# Patient Record
Sex: Female | Born: 1975 | Hispanic: No | Marital: Married | Smoking: Never smoker
Health system: Southern US, Community
[De-identification: ages and names within clinical notes are randomized; demographics above are authoritative.]

## PROBLEM LIST (undated history)

## (undated) DIAGNOSIS — E7800 Pure hypercholesterolemia, unspecified: Secondary | ICD-10-CM

## (undated) DIAGNOSIS — E119 Type 2 diabetes mellitus without complications: Secondary | ICD-10-CM

## (undated) DIAGNOSIS — I10 Essential (primary) hypertension: Secondary | ICD-10-CM

---

## 2001-02-23 ENCOUNTER — Encounter: Admission: RE | Admit: 2001-02-23 | Discharge: 2001-02-23 | Payer: Self-pay | Admitting: Sports Medicine

## 2001-03-08 ENCOUNTER — Encounter: Admission: RE | Admit: 2001-03-08 | Discharge: 2001-03-08 | Payer: Self-pay | Admitting: Family Medicine

## 2001-03-22 ENCOUNTER — Encounter: Admission: RE | Admit: 2001-03-22 | Discharge: 2001-03-22 | Payer: Self-pay | Admitting: Family Medicine

## 2001-04-23 ENCOUNTER — Encounter: Admission: RE | Admit: 2001-04-23 | Discharge: 2001-04-23 | Payer: Self-pay | Admitting: Family Medicine

## 2001-05-01 ENCOUNTER — Inpatient Hospital Stay (HOSPITAL_COMMUNITY): Admission: AD | Admit: 2001-05-01 | Discharge: 2001-05-03 | Payer: Self-pay | Admitting: *Deleted

## 2002-10-17 ENCOUNTER — Ambulatory Visit (HOSPITAL_COMMUNITY): Admission: RE | Admit: 2002-10-17 | Discharge: 2002-10-17 | Payer: Self-pay | Admitting: *Deleted

## 2002-10-18 ENCOUNTER — Ambulatory Visit (HOSPITAL_COMMUNITY): Admission: RE | Admit: 2002-10-18 | Discharge: 2002-10-18 | Payer: Self-pay | Admitting: *Deleted

## 2002-10-18 ENCOUNTER — Encounter: Payer: Self-pay | Admitting: *Deleted

## 2002-11-09 ENCOUNTER — Encounter: Admission: RE | Admit: 2002-11-09 | Discharge: 2002-11-09 | Payer: Self-pay | Admitting: *Deleted

## 2002-11-09 ENCOUNTER — Ambulatory Visit (HOSPITAL_COMMUNITY): Admission: RE | Admit: 2002-11-09 | Discharge: 2002-11-09 | Payer: Self-pay | Admitting: *Deleted

## 2002-11-23 ENCOUNTER — Encounter: Admission: RE | Admit: 2002-11-23 | Discharge: 2002-11-23 | Payer: Self-pay | Admitting: *Deleted

## 2002-12-07 ENCOUNTER — Encounter: Admission: RE | Admit: 2002-12-07 | Discharge: 2002-12-07 | Payer: Self-pay | Admitting: *Deleted

## 2002-12-20 ENCOUNTER — Encounter: Admission: RE | Admit: 2002-12-20 | Discharge: 2002-12-20 | Payer: Self-pay | Admitting: *Deleted

## 2002-12-28 ENCOUNTER — Encounter: Admission: RE | Admit: 2002-12-28 | Discharge: 2002-12-28 | Payer: Self-pay | Admitting: *Deleted

## 2002-12-28 ENCOUNTER — Inpatient Hospital Stay (HOSPITAL_COMMUNITY): Admission: AD | Admit: 2002-12-28 | Discharge: 2003-01-01 | Payer: Self-pay | Admitting: *Deleted

## 2003-01-04 ENCOUNTER — Encounter: Admission: RE | Admit: 2003-01-04 | Discharge: 2003-01-04 | Payer: Self-pay | Admitting: *Deleted

## 2003-01-04 ENCOUNTER — Ambulatory Visit (HOSPITAL_COMMUNITY): Admission: RE | Admit: 2003-01-04 | Discharge: 2003-01-04 | Payer: Self-pay | Admitting: *Deleted

## 2003-01-18 ENCOUNTER — Encounter: Admission: RE | Admit: 2003-01-18 | Discharge: 2003-01-18 | Payer: Self-pay | Admitting: *Deleted

## 2003-01-18 ENCOUNTER — Ambulatory Visit (HOSPITAL_COMMUNITY): Admission: RE | Admit: 2003-01-18 | Discharge: 2003-01-18 | Payer: Self-pay | Admitting: Obstetrics and Gynecology

## 2003-01-25 ENCOUNTER — Encounter: Admission: RE | Admit: 2003-01-25 | Discharge: 2003-01-25 | Payer: Self-pay | Admitting: *Deleted

## 2003-02-28 ENCOUNTER — Inpatient Hospital Stay (HOSPITAL_COMMUNITY): Admission: AD | Admit: 2003-02-28 | Discharge: 2003-02-28 | Payer: Self-pay | Admitting: Obstetrics & Gynecology

## 2003-04-24 ENCOUNTER — Inpatient Hospital Stay (HOSPITAL_COMMUNITY): Admission: AD | Admit: 2003-04-24 | Discharge: 2003-04-27 | Payer: Self-pay | Admitting: Obstetrics & Gynecology

## 2006-04-02 ENCOUNTER — Ambulatory Visit: Payer: Self-pay | Admitting: Family Medicine

## 2006-04-02 ENCOUNTER — Encounter: Payer: Self-pay | Admitting: Family Medicine

## 2006-04-02 LAB — CONVERTED CEMR LAB
Basophils Absolute: 0 10*3/uL (ref 0.0–0.1)
Basophils Relative: 0 % (ref 0–1)
HCT: 38.2 % (ref 36.0–46.0)
Hemoglobin: 13.2 g/dL (ref 12.0–15.0)
Hepatitis B Surface Ag: NEGATIVE
MCHC: 34.6 g/dL (ref 30.0–36.0)
Monocytes Absolute: 0.3 10*3/uL (ref 0.2–0.7)
Neutrophils Relative %: 61 % (ref 43–77)
RBC: 4.24 M/uL (ref 3.87–5.11)
RDW: 13.4 % (ref 11.5–14.0)
Rh Type: POSITIVE
Rubella: 8.5 intl units/mL — ABNORMAL HIGH
WBC: 5.8 10*3/uL (ref 4.0–10.5)

## 2006-04-08 ENCOUNTER — Encounter (INDEPENDENT_AMBULATORY_CARE_PROVIDER_SITE_OTHER): Payer: Self-pay | Admitting: *Deleted

## 2006-04-08 ENCOUNTER — Ambulatory Visit: Payer: Self-pay | Admitting: Family Medicine

## 2006-04-08 ENCOUNTER — Encounter: Payer: Self-pay | Admitting: Family Medicine

## 2006-04-08 LAB — CONVERTED CEMR LAB
Chlamydia, DNA Probe: NEGATIVE
GC Probe Amp, Genital: NEGATIVE

## 2006-04-13 ENCOUNTER — Ambulatory Visit (HOSPITAL_COMMUNITY): Admission: RE | Admit: 2006-04-13 | Discharge: 2006-04-13 | Payer: Self-pay | Admitting: Sports Medicine

## 2006-05-06 ENCOUNTER — Ambulatory Visit: Payer: Self-pay | Admitting: Family Medicine

## 2006-05-11 ENCOUNTER — Ambulatory Visit (HOSPITAL_COMMUNITY): Admission: RE | Admit: 2006-05-11 | Discharge: 2006-05-11 | Payer: Self-pay | Admitting: Sports Medicine

## 2006-05-13 ENCOUNTER — Ambulatory Visit: Payer: Self-pay | Admitting: Family Medicine

## 2006-06-08 ENCOUNTER — Ambulatory Visit (HOSPITAL_COMMUNITY): Admission: RE | Admit: 2006-06-08 | Discharge: 2006-06-08 | Payer: Self-pay | Admitting: Sports Medicine

## 2006-06-23 ENCOUNTER — Ambulatory Visit: Payer: Self-pay | Admitting: Family Medicine

## 2006-07-07 ENCOUNTER — Encounter: Payer: Self-pay | Admitting: Family Medicine

## 2006-07-07 ENCOUNTER — Ambulatory Visit (HOSPITAL_COMMUNITY): Admission: RE | Admit: 2006-07-07 | Discharge: 2006-07-07 | Payer: Self-pay | Admitting: Sports Medicine

## 2006-07-08 ENCOUNTER — Encounter: Payer: Self-pay | Admitting: Family Medicine

## 2006-07-23 ENCOUNTER — Ambulatory Visit: Payer: Self-pay | Admitting: Family Medicine

## 2006-07-27 ENCOUNTER — Encounter: Payer: Self-pay | Admitting: Family Medicine

## 2006-07-27 ENCOUNTER — Ambulatory Visit: Payer: Self-pay | Admitting: Family Medicine

## 2006-07-27 LAB — CONVERTED CEMR LAB: Hemoglobin: 12.3 g/dL

## 2006-07-31 ENCOUNTER — Ambulatory Visit: Payer: Self-pay | Admitting: Family Medicine

## 2006-08-06 ENCOUNTER — Ambulatory Visit: Payer: Self-pay | Admitting: Family Medicine

## 2006-08-21 ENCOUNTER — Ambulatory Visit: Payer: Self-pay | Admitting: Family Medicine

## 2006-08-21 ENCOUNTER — Encounter: Payer: Self-pay | Admitting: Family Medicine

## 2006-09-08 ENCOUNTER — Ambulatory Visit: Payer: Self-pay | Admitting: Family Medicine

## 2006-09-08 ENCOUNTER — Encounter: Payer: Self-pay | Admitting: Family Medicine

## 2006-09-16 ENCOUNTER — Ambulatory Visit: Payer: Self-pay | Admitting: Sports Medicine

## 2006-09-23 ENCOUNTER — Ambulatory Visit: Payer: Self-pay | Admitting: Family Medicine

## 2006-09-23 ENCOUNTER — Ambulatory Visit (HOSPITAL_COMMUNITY): Admission: RE | Admit: 2006-09-23 | Discharge: 2006-09-23 | Payer: Self-pay | Admitting: Family Medicine

## 2006-09-30 ENCOUNTER — Ambulatory Visit: Payer: Self-pay | Admitting: Family Medicine

## 2006-09-30 LAB — CONVERTED CEMR LAB
KOH Prep: POSITIVE
Whiff Test: NEGATIVE

## 2006-10-06 ENCOUNTER — Ambulatory Visit: Payer: Self-pay | Admitting: Family Medicine

## 2006-10-12 ENCOUNTER — Ambulatory Visit: Payer: Self-pay | Admitting: Family Medicine

## 2006-10-12 ENCOUNTER — Inpatient Hospital Stay (HOSPITAL_COMMUNITY): Admission: AD | Admit: 2006-10-12 | Discharge: 2006-10-12 | Payer: Self-pay | Admitting: Obstetrics & Gynecology

## 2006-10-12 ENCOUNTER — Ambulatory Visit: Payer: Self-pay | Admitting: *Deleted

## 2006-10-14 ENCOUNTER — Ambulatory Visit: Payer: Self-pay | Admitting: Gynecology

## 2006-10-14 ENCOUNTER — Inpatient Hospital Stay (HOSPITAL_COMMUNITY): Admission: AD | Admit: 2006-10-14 | Discharge: 2006-10-16 | Payer: Self-pay | Admitting: Gynecology

## 2006-12-03 ENCOUNTER — Ambulatory Visit: Payer: Self-pay | Admitting: Family Medicine

## 2006-12-03 LAB — CONVERTED CEMR LAB: Beta hcg, urine, semiquantitative: NEGATIVE

## 2006-12-10 ENCOUNTER — Encounter: Payer: Self-pay | Admitting: Family Medicine

## 2007-02-22 ENCOUNTER — Telehealth (INDEPENDENT_AMBULATORY_CARE_PROVIDER_SITE_OTHER): Payer: Self-pay | Admitting: *Deleted

## 2007-03-02 ENCOUNTER — Ambulatory Visit: Payer: Self-pay | Admitting: Family Medicine

## 2007-03-16 ENCOUNTER — Encounter: Payer: Self-pay | Admitting: Family Medicine

## 2008-02-22 IMAGING — US US OB FOLLOW-UP
1 series · 14 of 25 positions shown · non-contrast
Comparison: none

OBSTETRICAL ULTRASOUND:

 This ultrasound exam was performed in the [HOSPITAL] Ultrasound Department.  The OB US report was generated in the AS system, and faxed to the ordering physician.  This report is also available in [REDACTED] PACS.

[Series 1: us ob re-eval · 14 of 25 slices shown]
[im 1/25]
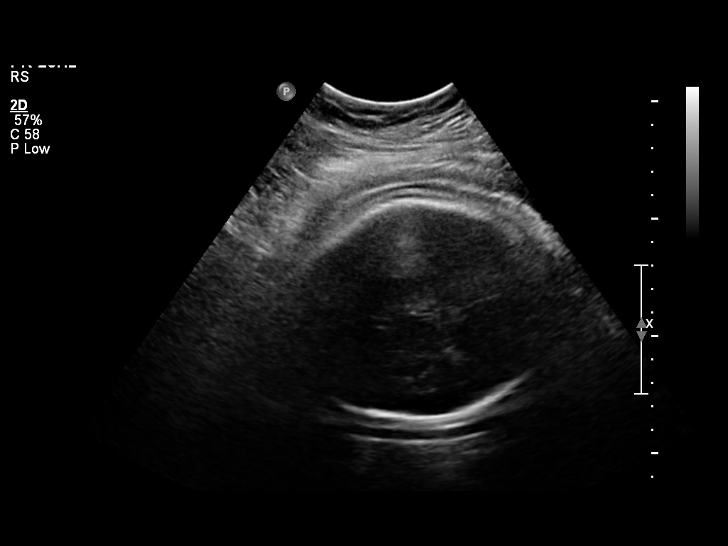
[im 3/25]
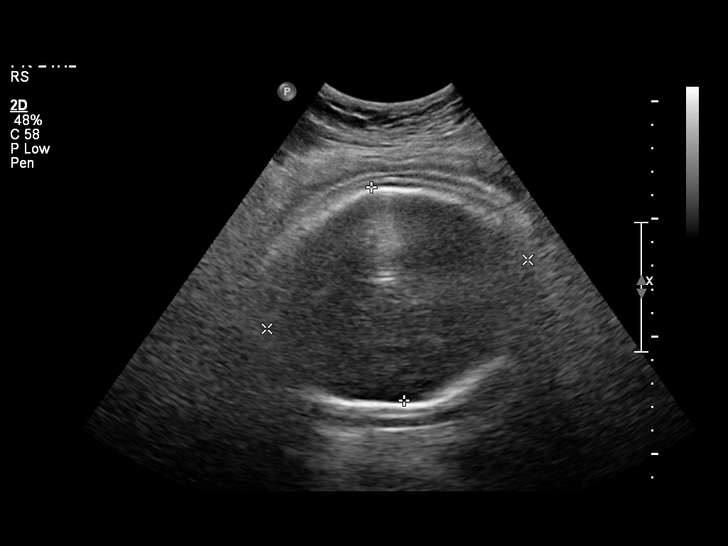
[im 5/25]
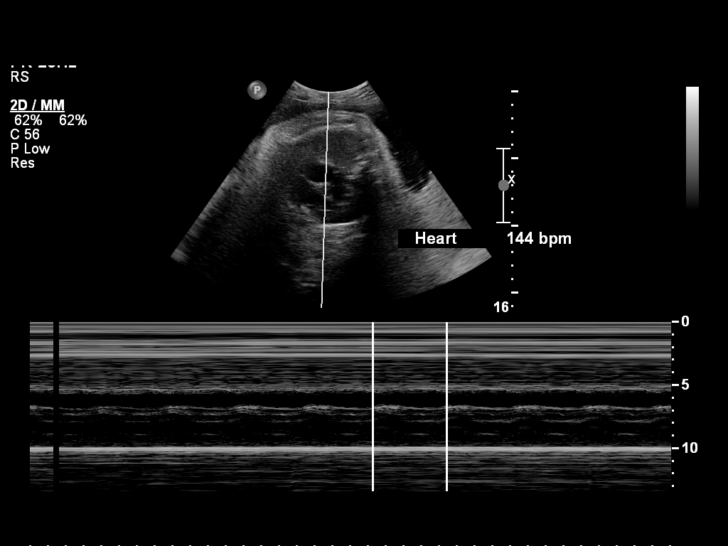
[im 7/25]
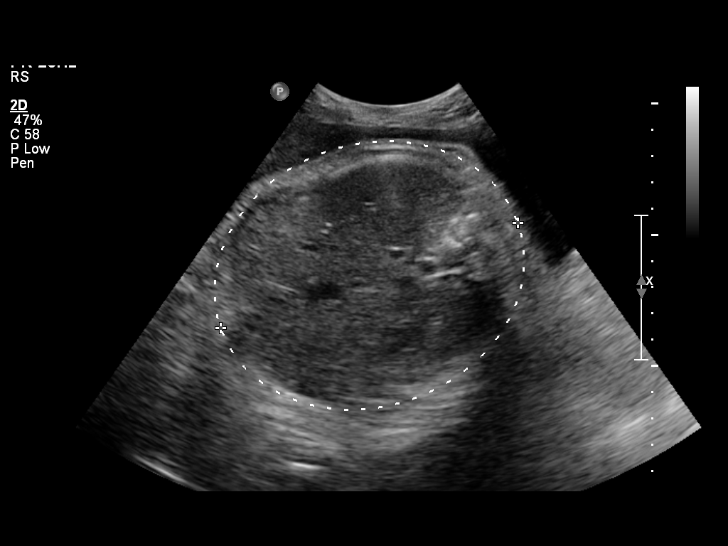
[im 9/25]
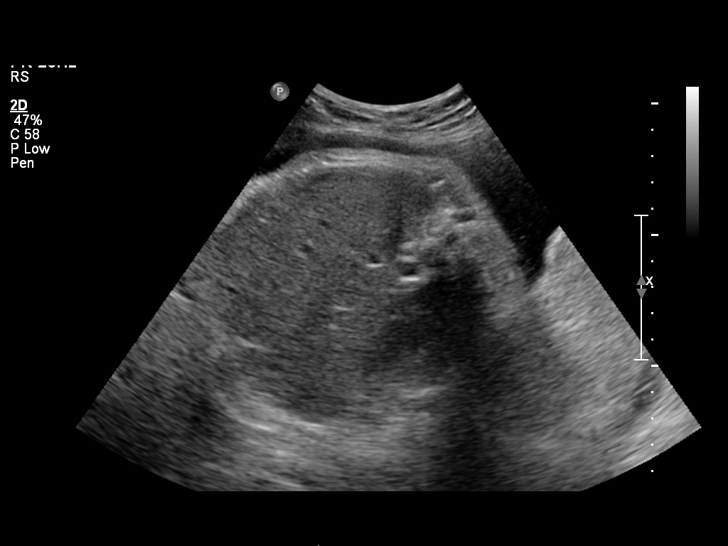
[im 10/25]
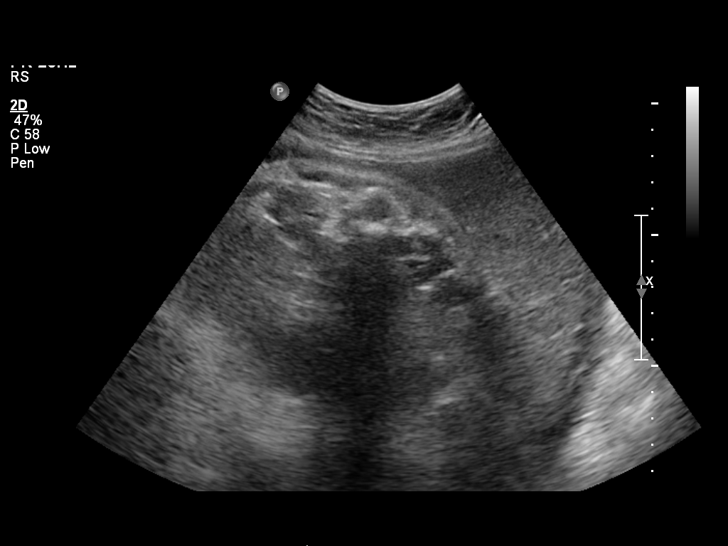
[im 12/25]
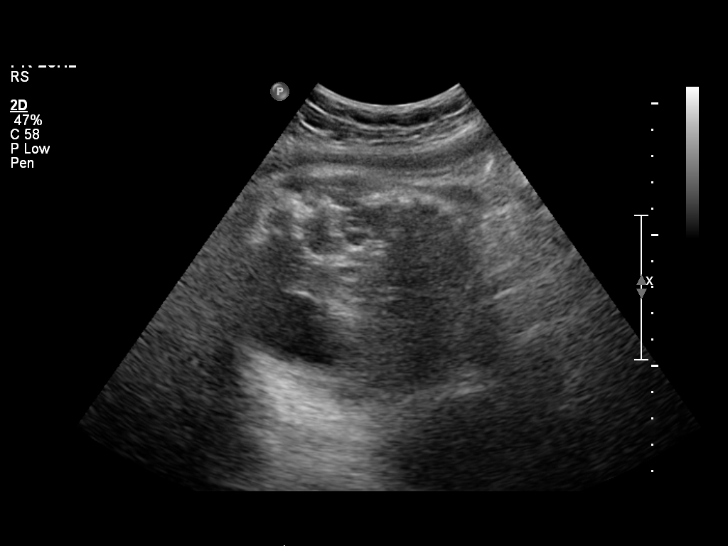
[im 14/25]
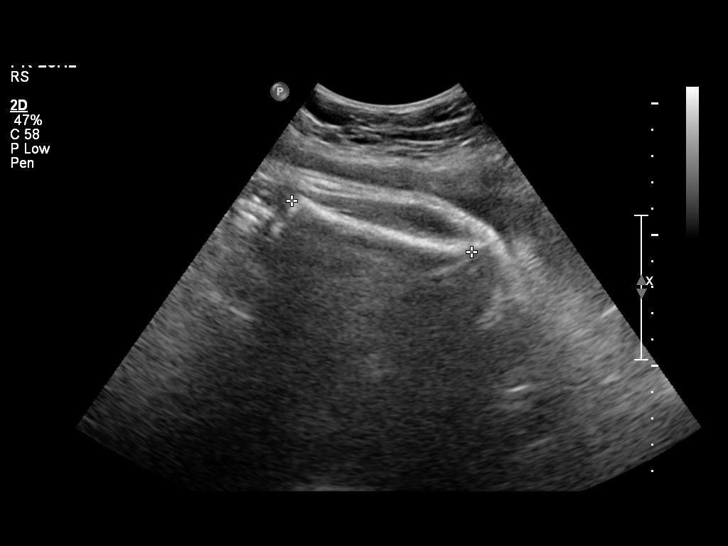
[im 16/25]
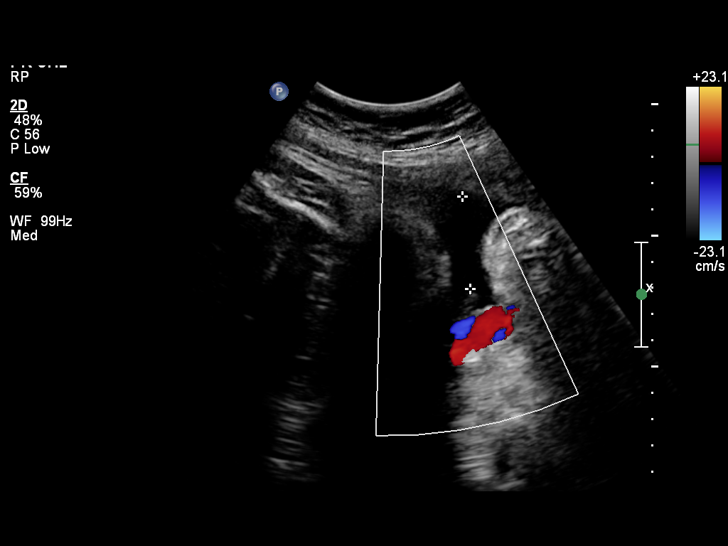
[im 17/25]
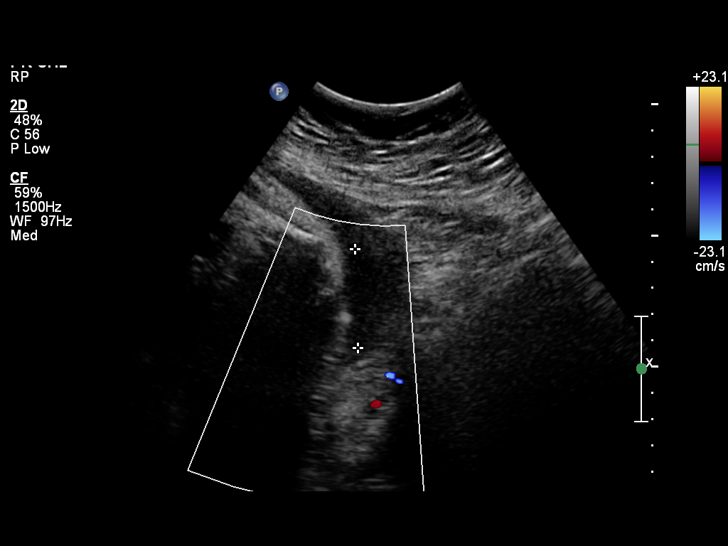
[im 19/25]
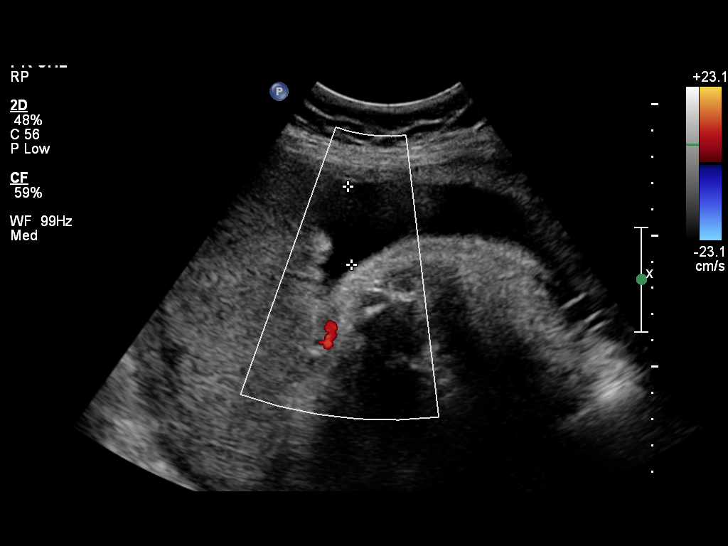
[im 21/25]
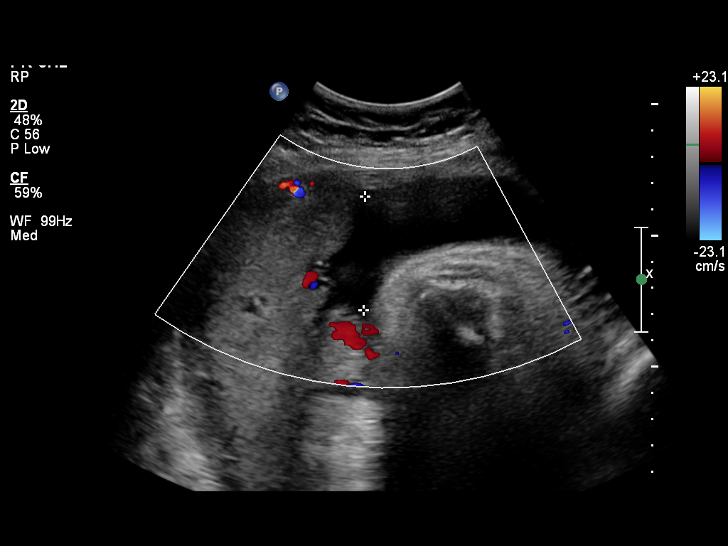
[im 23/25]
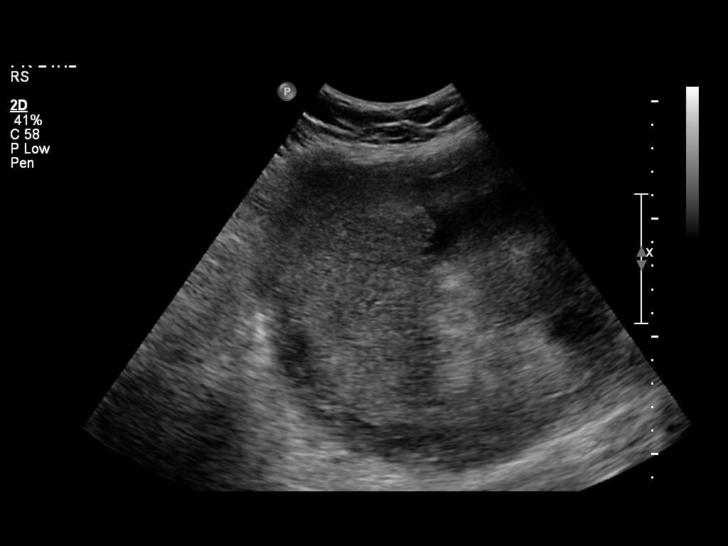
[im 25/25]
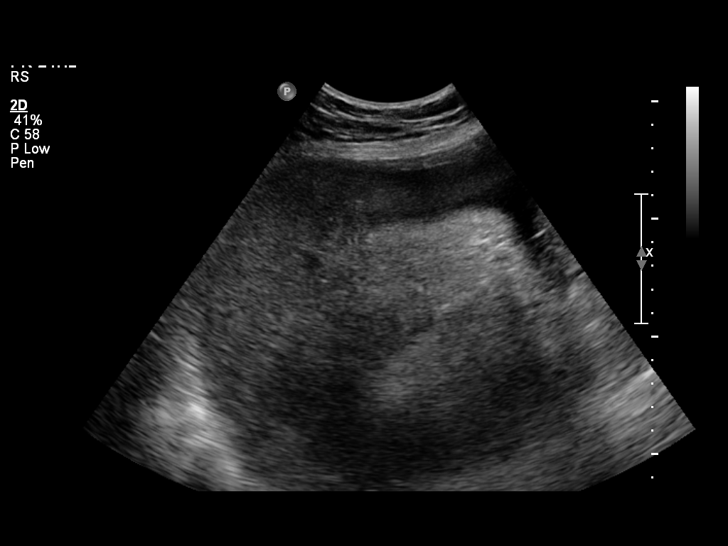

[14 of 25 positions shown; findings below may reference images not displayed]

IMPRESSION: See AS Obstetric US report.

## 2010-04-07 ENCOUNTER — Encounter: Payer: Self-pay | Admitting: Sports Medicine

## 2010-08-02 NOTE — Discharge Summary (Signed)
   NAMECHANNELLE, BOTTGER                   ACCOUNT NO.:  [id number]   MEDICAL RECORD NO.:  [id number]                   PATIENT TYPE:  INP   LOCATION:  9108                                 FACILITY:  [facility]   PHYSICIAN:  Phil D. Okey Dupre, M.D.                  DATE OF BIRTH:  08-28-1975   DATE OF ADMISSION:  12/28/2002  DATE OF DISCHARGE:  01/01/2003                                 DISCHARGE SUMMARY   ADMISSION DIAGNOSES:  56. 35 year old gravida 3, para 1, 1, 0, 1, at 27-1/7 weeks with group B     streptococcus bacteria.  2. Fetal anomaly of a severe meningomyelocele.  3. Cervical change of 1, 50, and high.   DISCHARGE DIAGNOSES:  54. 35 year old gravida 3, para 1, 1, 0, 1, at 27-5/7 weeks.  2. Status post three days of intravenous antibiotics for group B     Streptococcus bacteria.  3. Unchanged cervical exam.   DISCHARGE MEDICATION:  Prenatal vitamin on p.o. daily.   ADMISSION HISTORY:  Ms. Jacquenette Shone was admitted at 27-1/7 weeks from  [facility] for GBS bacteria.  She was placed on IV Unasyn for a  three-day course.   HOSPITAL COURSE:  On admission she was noted to have a cervical exam of 1,  50 and high.  She had no contractions during her hospital stay.  On the day  of discharge she had no complaints.  Her cervical exam was unchanged.   IMPRESSION:  1. GBS bacteria - she remained afebrile and asymptomatic.  Her GC and     chlamydia were noted to be negative and a fetal fibrinogen was done which     was also negative.  2. Meningomyelocele.  The plan is to have delivery at [facility].     According to the patient this has not been arranged but she does have     follow up [facility].   CONDITION ON DISCHARGE:  The patient was discharged home in stable  condition.   INSTRUCTIONS GIVEN TO PATIENT:  The patient was told of her medial regimen.  She was told to keep her [facility] appointment three days after  discharge.     Maurice March, M.D.                        Phil D. Okey Dupre, M.D.    LC/MEDQ  D:  01/01/2003  T:  01/02/2003  Job:  [id number]   cc:   [facility]

## 2010-08-02 NOTE — Discharge Summary (Signed)
Stephanie Brady, Stephanie Brady                   ACCOUNT NO.:  [id number]   MEDICAL RECORD NO.:  [id number]                   PATIENT TYPE:  INP   LOCATION:  9320                                 FACILITY:  [facility]   PHYSICIAN:  Lesly Dukes, M.D.              DATE OF BIRTH:  [birth year]   DATE OF ADMISSION:  04/24/2003  DATE OF DISCHARGE:  04/27/2003                                 DISCHARGE SUMMARY   DISCHARGE DIAGNOSES:  1. Pyelonephritis.  2. Postpartum 5 weeks.   DISCHARGE MEDICATIONS:  1. Ceftin 500 mg b.i.d. x10 days.  2. Tylenol p.r.n.   SPECIAL INSTRUCTIONS:  Patient is to return to the MAU if abdominal pain,  back pain, or fever return.   FOLLOW UP:  Patient will keep her 6-week postpartum appointment.   BRIEF ADMISSION HISTORY:  [age]-year-old G3, P2-1-0-3, who presented at  5 weeks postpartum with lower abdominal pain.  Patient had a C-section in  January and __________ in the hospital.  Patient reports being sick x2 days  with fever and lower abdominal pain, some nausea before admission.  She had  been taking her ibuprofen and Motrin p.r.n.  On admission no CVA tenderness  was noted, tender midline in the lower abdomen on physical exam, urinalysis  showed moderate leukocyte esterase, 30 protein, large hemoglobin and micro  with white blood cells 21-50, wet prep showed moderate white blood cells,  initial white blood cell count was 8.1.   HOSPITAL COURSE:  Patient was admitted for pyelonephritis and was begun on  Rocephin q.24h. 1 g IV.  Patient continued to spike temperatures throughout  hospitalization with a T-max of 103.7.  Patient had been afebrile  approximately 23 hours at discharge.  Considering patient's continued  temperature spikes blood cultures x2 were drawn and had no growth to date at  discharge.  Repeat CBC showed white blood cell count had decreased to 4.3.  Urine culture grew out E. coli greater than 100,000 colonies pan-sensitive  except for  Bactrim.  Patient did have a urine pregnancy test that was  negative on admission.  Patient was asymptomatic at discharge and was  discharged with Ceftin 500 mg b.i.d. x10 days and it was explained to her  that it was important to complete the course of medication.     Anastasio Auerbach, MD                          Lesly Dukes, M.D.    AD/MEDQ  D:  04/27/2003  T:  04/27/2003  Job:  [id number]

## 2010-09-14 ENCOUNTER — Emergency Department (HOSPITAL_COMMUNITY): Payer: Self-pay

## 2010-09-14 ENCOUNTER — Emergency Department (HOSPITAL_COMMUNITY)
Admission: EM | Admit: 2010-09-14 | Discharge: 2010-09-14 | Disposition: A | Discharge status: Home / Self Care | Payer: Self-pay | Attending: Emergency Medicine | Admitting: Emergency Medicine

## 2010-09-14 DIAGNOSIS — R0602 Shortness of breath: Secondary | ICD-10-CM | POA: Insufficient documentation

## 2010-09-14 DIAGNOSIS — R109 Unspecified abdominal pain: Secondary | ICD-10-CM | POA: Insufficient documentation

## 2010-09-14 DIAGNOSIS — R110 Nausea: Secondary | ICD-10-CM | POA: Insufficient documentation

## 2010-09-14 DIAGNOSIS — K8020 Calculus of gallbladder without cholecystitis without obstruction: Secondary | ICD-10-CM | POA: Insufficient documentation

## 2010-09-14 DIAGNOSIS — R079 Chest pain, unspecified: Secondary | ICD-10-CM | POA: Insufficient documentation

## 2010-09-14 LAB — URINALYSIS, ROUTINE W REFLEX MICROSCOPIC
Bilirubin Urine: NEGATIVE
Glucose, UA: NEGATIVE mg/dL
Hgb urine dipstick: NEGATIVE
Specific Gravity, Urine: 1.01 (ref 1.005–1.030)

## 2010-09-14 LAB — COMPREHENSIVE METABOLIC PANEL
ALT: 34 U/L (ref 0–35)
Albumin: 3.8 g/dL (ref 3.5–5.2)
Alkaline Phosphatase: 72 U/L (ref 39–117)
Chloride: 99 mEq/L (ref 96–112)
Glucose, Bld: 151 mg/dL — ABNORMAL HIGH (ref 70–99)
Potassium: 3 mEq/L — ABNORMAL LOW (ref 3.5–5.1)
Sodium: 135 mEq/L (ref 135–145)
Total Bilirubin: 0.8 mg/dL (ref 0.3–1.2)
Total Protein: 6.7 g/dL (ref 6.0–8.3)

## 2010-09-14 LAB — DIFFERENTIAL
Basophils Absolute: 0 10*3/uL (ref 0.0–0.1)
Basophils Relative: 0 % (ref 0–1)
Lymphocytes Relative: 27 % (ref 12–46)
Monocytes Absolute: 0.5 10*3/uL (ref 0.1–1.0)
Neutro Abs: 6 10*3/uL (ref 1.7–7.7)
Neutrophils Relative %: 65 % (ref 43–77)

## 2010-09-14 LAB — CBC
HCT: 37.2 % (ref 36.0–46.0)
Hemoglobin: 13.4 g/dL (ref 12.0–15.0)
MCHC: 36 g/dL (ref 30.0–36.0)
WBC: 9.3 10*3/uL (ref 4.0–10.5)

## 2010-09-14 LAB — URINE MICROSCOPIC-ADD ON

## 2010-12-30 LAB — RPR: RPR Ser Ql: NONREACTIVE

## 2010-12-30 LAB — CBC
MCHC: 35
RDW: 13.6

## 2011-02-10 DIAGNOSIS — K8020 Calculus of gallbladder without cholecystitis without obstruction: Secondary | ICD-10-CM | POA: Insufficient documentation

## 2014-11-14 DIAGNOSIS — E119 Type 2 diabetes mellitus without complications: Secondary | ICD-10-CM | POA: Insufficient documentation

## 2014-11-14 DIAGNOSIS — I10 Essential (primary) hypertension: Secondary | ICD-10-CM | POA: Insufficient documentation

## 2016-08-02 DIAGNOSIS — E1140 Type 2 diabetes mellitus with diabetic neuropathy, unspecified: Secondary | ICD-10-CM | POA: Insufficient documentation

## 2016-11-18 DIAGNOSIS — R112 Nausea with vomiting, unspecified: Secondary | ICD-10-CM | POA: Insufficient documentation

## 2016-11-18 DIAGNOSIS — E872 Acidosis, unspecified: Secondary | ICD-10-CM | POA: Insufficient documentation

## 2016-11-18 DIAGNOSIS — R0981 Nasal congestion: Secondary | ICD-10-CM | POA: Insufficient documentation

## 2017-05-06 DIAGNOSIS — R5383 Other fatigue: Secondary | ICD-10-CM | POA: Insufficient documentation

## 2017-06-16 DIAGNOSIS — S46819A Strain of other muscles, fascia and tendons at shoulder and upper arm level, unspecified arm, initial encounter: Secondary | ICD-10-CM | POA: Insufficient documentation

## 2017-07-13 DIAGNOSIS — M79676 Pain in unspecified toe(s): Secondary | ICD-10-CM | POA: Insufficient documentation

## 2017-07-13 DIAGNOSIS — L600 Ingrowing nail: Secondary | ICD-10-CM | POA: Insufficient documentation

## 2017-07-29 ENCOUNTER — Other Ambulatory Visit: Payer: Self-pay

## 2017-07-29 ENCOUNTER — Ambulatory Visit (HOSPITAL_COMMUNITY)
Admission: EM | Admit: 2017-07-29 | Discharge: 2017-07-29 | Disposition: A | Discharge status: Home / Self Care | Payer: Self-pay | Attending: Family Medicine | Admitting: Family Medicine

## 2017-07-29 ENCOUNTER — Encounter (HOSPITAL_COMMUNITY): Payer: Self-pay | Admitting: Emergency Medicine

## 2017-07-29 DIAGNOSIS — L600 Ingrowing nail: Secondary | ICD-10-CM

## 2017-07-29 HISTORY — DX: Type 2 diabetes mellitus without complications: E11.9

## 2017-07-29 HISTORY — DX: Pure hypercholesterolemia, unspecified: E78.00

## 2017-07-29 HISTORY — DX: Essential (primary) hypertension: I10

## 2017-07-29 MED ORDER — HYDROCODONE-ACETAMINOPHEN 5-325 MG PO TABS: 1.0000 | ORAL_TABLET | Freq: Four times a day (QID) | ORAL | 0 refills | Status: AC | PRN

## 2017-07-29 NOTE — ED Triage Notes (Signed)
Daughter/translator stated, she has an ingrown toenail on rt. Big toe. For 2 weeks.

## 2017-07-29 NOTE — ED Provider Notes (Signed)
[facility]   [id number] 07/29/17 Arrival Time: 1656   SUBJECTIVE:  Stephanie Brady is a 42 y.o. female who presents to the urgent care with complaint of ingrown toenail on rt. Big toe. For 2 weeks.  Patient is being treated for nail fungus  Past Medical History:  Diagnosis Date  . Diabetes mellitus without complication (HCC)   . Hypercholesterolemia   . Hypertension    No family history on file. Social History   Socioeconomic History  . Marital status: Married    Spouse name: Not on file  . Number of children: Not on file  . Years of education: Not on file  . Highest education level: Not on file  Occupational History  . Not on file  Social Needs  . Financial resource strain: Not on file  . Food insecurity:    Worry: Not on file    Inability: Not on file  . Transportation needs:    Medical: Not on file    Non-medical: Not on file  Tobacco Use  . Smoking status: Never Smoker  . Smokeless tobacco: Never Used  Substance and Sexual Activity  . Alcohol use: Not Currently  . Drug use: Not Currently  . Sexual activity: Not on file  Lifestyle  . Physical activity:    Days per week: Not on file    Minutes per session: Not on file  . Stress: Not on file  Relationships  . Social connections:    Talks on phone: Not on file    Gets together: Not on file    Attends religious service: Not on file    Active member of club or organization: Not on file    Attends meetings of clubs or organizations: Not on file    Relationship status: Not on file  . Intimate partner violence:    Fear of current or ex partner: Not on file    Emotionally abused: Not on file    Physically abused: Not on file    Forced sexual activity: Not on file  Other Topics Concern  . Not on file  Social History Narrative  . Not on file   No outpatient medications have been marked as taking for the 07/29/17 encounter [facility] Encounter).   Not on File    ROS: As per HPI,  remainder of ROS negative.   OBJECTIVE:   Vitals:   07/29/17 1826 07/29/17 1827 07/29/17 1828  BP: 116/71    Pulse: 63    Resp: 16    Temp: 98.4 F (36.9 C)    TempSrc: Oral    SpO2: 100%    Weight:  195 lb (88.5 kg)   Height:    (1.575 m)     General appearance: alert; no distress Eyes: PERRL; EOMI; conjunctiva normal HENT: normocephalic; atraumatic;oral mucosa normal Neck: supple Skin: warm and dry; ingrown right toenail with obvious onychomycotic features. Neurologic: normal gait; grossly normal Psychological: alert and cooperative; normal mood and affect      Labs:  Results for orders placed or performed during the hospital encounter of 09/14/10  Differential  Result Value Ref Range   Neutrophils Relative % 65 43 - 77 %   Neutro Abs 6.0 1.7 - 7.7 K/uL   Lymphocytes Relative 27 12 - 46 %   Lymphs Abs 2.5 0.7 - 4.0 K/uL   Monocytes Relative 5 3 - 12 %   Monocytes Absolute 0.5 0.1 - 1.0 K/uL   Eosinophils Relative 3 0 - 5 %  Eosinophils Absolute 0.3 0.0 - 0.7 K/uL   Basophils Relative 0 0 - 1 %   Basophils Absolute 0.0 0.0 - 0.1 K/uL  CBC  Result Value Ref Range   WBC 9.3 4.0 - 10.5 K/uL   RBC 4.24 3.87 - 5.11 MIL/uL   Hemoglobin 13.4 12.0 - 15.0 g/dL   HCT 16.1 09.6 - 04.5 %   MCV 87.7 78.0 - 100.0 fL   MCH 31.6 26.0 - 34.0 pg   MCHC 36.0 30.0 - 36.0 g/dL   RDW 40.9 81.1 - 91.4 %   Platelets 202 150 - 400 K/uL  Comprehensive metabolic panel  Result Value Ref Range   Sodium 135 135 - 145 mEq/L   Potassium 3.0 (L) 3.5 - 5.1 mEq/L   Chloride 99 96 - 112 mEq/L   CO2 26 19 - 32 mEq/L   Glucose, Bld 151 (H) 70 - 99 mg/dL   BUN 14 6 - 23 mg/dL   Creatinine, Ser <7.82 (L) 0.50 - 1.10 mg/dL   Calcium 8.5 8.4 - 95.6 mg/dL   Total Protein 6.7 6.0 - 8.3 g/dL   Albumin 3.8 3.5 - 5.2 g/dL   AST 46 (H) 0 - 37 U/L   ALT 34 0 - 35 U/L   Alkaline Phosphatase 72 39 - 117 U/L   Total Bilirubin 0.8 0.3 - 1.2 mg/dL   GFR calc non Af Amer NOT CALCULATED >60  mL/min   GFR calc Af Amer NOT CALCULATED >60 mL/min  Lipase, blood  Result Value Ref Range   Lipase 27 11 - 59 U/L  Pregnancy, urine  Result Value Ref Range   Preg Test, Ur NEGATIVE   Urinalysis, Routine w reflex microscopic  Result Value Ref Range   Color, Urine YELLOW YELLOW   APPearance CLOUDY (A) CLEAR   Specific Gravity, Urine 1.010 1.005 - 1.030   pH 6.5 5.0 - 8.0   Glucose, UA NEGATIVE NEGATIVE mg/dL   Hgb urine dipstick NEGATIVE NEGATIVE   Bilirubin Urine NEGATIVE NEGATIVE   Ketones, ur NEGATIVE NEGATIVE mg/dL   Protein, ur NEGATIVE NEGATIVE mg/dL   Urobilinogen, UA 0.2 0.0 - 1.0 mg/dL   Nitrite NEGATIVE NEGATIVE   Leukocytes, UA TRACE (A) NEGATIVE  Urine microscopic-add on  Result Value Ref Range   Squamous Epithelial / LPF MANY (A) RARE   WBC, UA 0-2 <3 WBC/hpf    Labs Reviewed - No data to display  No results found.     ASSESSMENT & PLAN:  1. Ingrown toenail of right foot     Meds ordered this encounter  Medications  . HYDROcodone-acetaminophen (NORCO) 5-325 MG tablet    Sig: Take 1 tablet by mouth every 6 (six) hours as needed for moderate pain.    Dispense:  12 tablet    Refill:  0    Reviewed expectations re: course of current medical issues. Questions answered. Outlined signs and symptoms indicating need for more acute intervention. Patient verbalized understanding. After Visit Summary given.    Procedures:  Distal right toe was anesthetized with local 2% Xylocaine after spraying the area with cold spray. The tibial side of the nail was then elevated and removed without complication      Elvina Sidle, MD 07/29/17 Windell Moment

## 2017-07-29 NOTE — Discharge Instructions (Addendum)
Lava el dedo tres veces al dia para 5 dias.  Regresa si le duele mas.

## 2018-02-24 DIAGNOSIS — M5412 Radiculopathy, cervical region: Secondary | ICD-10-CM | POA: Insufficient documentation

## 2018-03-15 DIAGNOSIS — G44209 Tension-type headache, unspecified, not intractable: Secondary | ICD-10-CM | POA: Insufficient documentation

## 2018-03-15 DIAGNOSIS — R079 Chest pain, unspecified: Secondary | ICD-10-CM | POA: Insufficient documentation

## 2018-05-26 ENCOUNTER — Other Ambulatory Visit: Payer: Self-pay

## 2018-05-26 ENCOUNTER — Ambulatory Visit (INDEPENDENT_AMBULATORY_CARE_PROVIDER_SITE_OTHER): Payer: Self-pay | Admitting: Podiatry

## 2018-05-26 VITALS — BP 139/73 | HR 89

## 2018-05-26 DIAGNOSIS — E7800 Pure hypercholesterolemia, unspecified: Secondary | ICD-10-CM | POA: Insufficient documentation

## 2018-05-26 DIAGNOSIS — M7751 Other enthesopathy of right foot: Secondary | ICD-10-CM

## 2018-05-26 DIAGNOSIS — B070 Plantar wart: Secondary | ICD-10-CM

## 2018-05-26 MED ORDER — MELOXICAM 15 MG PO TABS: 15.0000 mg | ORAL_TABLET | Freq: Every day | ORAL | 1 refills | Status: AC

## 2018-05-26 NOTE — Patient Instructions (Signed)
Over-the-counter wart remover

## 2018-05-30 NOTE — Progress Notes (Signed)
   Subjective: 43 year old female with PMHx of DM presenting today as a new patient with a chief complaint of a painful lesion noted to the plantar aspect of the left foot that appeared one month ago. She also reports pain in the right great toe that began 4 months ago. Walking increases the pain from both areas. She has not had any treatment for her symptoms. Patient is here for further evaluation and treatment.    Past Medical History:  Diagnosis Date  . Diabetes mellitus without complication (HCC)   . Hypercholesterolemia   . Hypertension     Objective: Physical Exam General: The patient is alert and oriented x3 in no acute distress.   Dermatology: Hyperkeratotic skin lesion noted to the plantar aspect of the left foot approximately 1 cm in diameter. Pinpoint bleeding noted upon debridement. Skin is warm, dry and supple bilateral lower extremities. Negative for open lesions or macerations.   Vascular: Palpable pedal pulses bilaterally. No edema or erythema noted. Capillary refill within normal limits.   Neurological: Epicritic and protective threshold grossly intact bilaterally.    Musculoskeletal Exam: Pain on palpation to the noted skin lesion as well as to the right great toe.  Range of motion within normal limits to all pedal and ankle joints bilateral. Muscle strength 5/5 in all groups bilateral.    Assessment: #1 plantar wart left foot #2 right great toe capsulitis      Plan of Care:  #1 Patient was evaluated. #2 Excisional debridement of the plantar wart lesion was performed using a chisel blade. Salinocaine was applied and the lesion was dressed with a dry sterile dressing. #3 Prescription for Meloxicam provided to patient. #4 Recommended OTC wart remover.  #5 patient is to return to clinic as needed.   Felecia Shelling, DPM [facility]  Dr. Felecia Shelling, DPM    [address]                                        [place], [place] [address]                 Office [phone]  Fax [phone]

## 2018-09-08 ENCOUNTER — Encounter (HOSPITAL_COMMUNITY): Payer: Self-pay | Admitting: Emergency Medicine

## 2018-09-08 ENCOUNTER — Emergency Department (HOSPITAL_COMMUNITY)
Admission: EM | Admit: 2018-09-08 | Discharge: 2018-09-08 | Disposition: A | Discharge status: Home / Self Care | Payer: Self-pay | Attending: Emergency Medicine | Admitting: Emergency Medicine

## 2018-09-08 ENCOUNTER — Emergency Department (HOSPITAL_COMMUNITY): Payer: Self-pay

## 2018-09-08 ENCOUNTER — Other Ambulatory Visit: Payer: Self-pay | Admitting: *Deleted

## 2018-09-08 ENCOUNTER — Other Ambulatory Visit: Payer: Self-pay

## 2018-09-08 DIAGNOSIS — I10 Essential (primary) hypertension: Secondary | ICD-10-CM | POA: Insufficient documentation

## 2018-09-08 DIAGNOSIS — Z79899 Other long term (current) drug therapy: Secondary | ICD-10-CM | POA: Insufficient documentation

## 2018-09-08 DIAGNOSIS — Z20822 Contact with and (suspected) exposure to covid-19: Secondary | ICD-10-CM

## 2018-09-08 DIAGNOSIS — Z7984 Long term (current) use of oral hypoglycemic drugs: Secondary | ICD-10-CM | POA: Insufficient documentation

## 2018-09-08 DIAGNOSIS — R0602 Shortness of breath: Secondary | ICD-10-CM | POA: Insufficient documentation

## 2018-09-08 DIAGNOSIS — R079 Chest pain, unspecified: Secondary | ICD-10-CM | POA: Insufficient documentation

## 2018-09-08 DIAGNOSIS — E119 Type 2 diabetes mellitus without complications: Secondary | ICD-10-CM | POA: Insufficient documentation

## 2018-09-08 LAB — CBC
HCT: 39.9 % (ref 36.0–46.0)
Hemoglobin: 14.2 g/dL (ref 12.0–15.0)
MCH: 32.2 pg (ref 26.0–34.0)
MCHC: 35.6 g/dL (ref 30.0–36.0)
MCV: 90.5 fL (ref 80.0–100.0)
Platelets: 258 10*3/uL (ref 150–400)
RBC: 4.41 MIL/uL (ref 3.87–5.11)
RDW: 12.3 % (ref 11.5–15.5)
WBC: 7.6 10*3/uL (ref 4.0–10.5)
nRBC: 0 % (ref 0.0–0.2)

## 2018-09-08 LAB — BASIC METABOLIC PANEL
Anion gap: 11 (ref 5–15)
BUN: 11 mg/dL (ref 6–20)
CO2: 23 mmol/L (ref 22–32)
Calcium: 9.6 mg/dL (ref 8.9–10.3)
Chloride: 103 mmol/L (ref 98–111)
Creatinine, Ser: 0.72 mg/dL (ref 0.44–1.00)
GFR calc Af Amer: >60 mL/min (ref >60)
GFR calc non Af Amer: >60 mL/min (ref >60)
Glucose, Bld: 93 mg/dL (ref 70–99)
Potassium: 3.9 mmol/L (ref 3.5–5.1)
Sodium: 137 mmol/L (ref 135–145)

## 2018-09-08 LAB — TROPONIN I (HIGH SENSITIVITY)
Troponin I (High Sensitivity): <2 ng/L (ref <18)
Troponin I (High Sensitivity): <2 ng/L (ref <18)

## 2018-09-08 MED ORDER — SODIUM CHLORIDE 0.9% FLUSH: 3.0000 mL | Freq: Once | INTRAVENOUS | Status: DC

## 2018-09-08 NOTE — ED Notes (Signed)
Patient verbalizes understanding of discharge instructions. Opportunity for questioning and answers were provided. Armband removed by staff, pt discharged from ED home via POV.  

## 2018-09-08 NOTE — ED Notes (Addendum)
Pt's daughter, Seth Bake, at [phone] for updates and to be picked up at discharge.

## 2018-09-08 NOTE — ED Provider Notes (Signed)
[facility]   CSN: [id number] Arrival date & time: 09/08/18  0013     History   Chief Complaint Chief Complaint  Patient presents with  . Chest Pain    HPI Stephanie Brady is a 43 y.o. female.     Patient with history of hypertension, hyperlipidemia, and diabetes presents to the emergency department with a chief complaint of chest pain x1 week.  She reports that the pain is been constant.  She reports also having some tingling sensation.  She reports feeling anxious and having some episodes of shortness of breath.  She has not taken anything for symptoms.  She denies any fever, chills, cough.  Denies any other associated symptoms.  Denies any known heart problems.  No history of PE or DVT.  The history is provided by the patient. The history is limited by a language barrier. A language interpreter was used (spanish interpreter).    Past Medical History:  Diagnosis Date  . Diabetes mellitus without complication (HCC)   . Hypercholesterolemia   . Hypertension     Patient Active Problem List   Diagnosis Date Noted  . Hypercholesterolemia 05/26/2018  . Chest pain 03/15/2018  . Tension-type headache 03/15/2018  . Cervical radiculopathy 02/24/2018  . Ingrowing toenail 07/13/2017  . Pain in toe 07/13/2017  . Strain of trapezius muscle 06/16/2017  . Fatigue 05/06/2017  . Acidosis 11/18/2016  . Nasal congestion 11/18/2016  . Nausea and vomiting 11/18/2016  . Diabetic neuropathy (HCC) 08/02/2016  . Benign essential hypertension 11/14/2014  . Diabetes mellitus (HCC) 11/14/2014  . Symptomatic cholelithiasis 02/10/2011    History reviewed. No pertinent surgical history.   OB History   No obstetric history on file.      Home Medications    Prior to Admission medications   Medication Sig Start Date End Date Taking? Authorizing Provider  acetaminophen (TYLENOL) 500 MG tablet Take by mouth.    [provider]  cyclobenzaprine (FLEXERIL) 10 MG tablet cyclobenzaprine 10 mg tablet  Take 1 tablet every day by oral route at bedtime.    [provider]  cyclobenzaprine (FLEXERIL) 5 MG tablet Take 5 mg by mouth at bedtime. 12/29/17   [provider]  Dulaglutide (TRULICITY) 0.75 MG/0.5ML SOPN Trulicity 0.75 mg/0.5 mL subcutaneous pen injector  Inject 0.5 mL every week by subcutaneous route.    [provider]  FOLBIC 2.5-25-2 MG TABS tablet Take 1 tablet by mouth daily. 03/15/18   [provider]  gabapentin (NEURONTIN) 300 MG capsule Take 300 mg by mouth 2 (two) times daily. 03/15/18   [provider]  HYDROcodone-acetaminophen (NORCO) 5-325 MG tablet Take 1 tablet by mouth every 6 (six) hours as needed for moderate pain. 07/29/17   Elvina SidleLauenstein, Kurt, MD  hydrOXYzine (ATARAX/VISTARIL) 50 MG tablet Take 50 mg by mouth every 6 (six) hours as needed. 03/15/18   [provider]  lisinopril (PRINIVIL,ZESTRIL) 20 MG tablet lisinopril 20 mg tablet  Take 1 tablet every day by oral route for 30 days.    [provider]  lovastatin (MEVACOR) 10 MG tablet Take 10 mg by mouth every evening. 12/30/17   [provider]  lovastatin (MEVACOR) 40 MG tablet lovastatin 40 mg tablet  TAKE 1 TABLET BY MOUTH AT BEDTIME    [provider]  meloxicam (MOBIC) 15 MG tablet Take 1 tablet (15 mg total) by mouth daily. 05/26/18   Felecia ShellingEvans, Brent M, DPM  metFORMIN (GLUCOPHAGE) 1000 MG tablet  metformin 1,000 mg tablet  TAKE 1 TABLET BY MOUTH TWICE DAILY    [provider]    Family History No family history on file.  Social History Social History   Tobacco Use  . Smoking status: Never Smoker  . Smokeless tobacco: Never Used  Substance Use Topics  . Alcohol use: Not Currently  . Drug use: Not Currently     Allergies   Patient has no allergy information on record.   Review of Systems Review of Systems  All other systems  reviewed and are negative.    Physical Exam Updated Vital Signs BP 121/62 (BP Location: Left Arm)   Pulse 65   Temp 98.7 F (37.1 C) (Oral)   Resp 18   Ht 5\' 3"  (1.6 m)   Wt 88.5 kg   SpO2 96%   BMI 34.54 kg/m   Physical Exam Vitals signs and nursing note reviewed.  Constitutional:      General: She is not in acute distress.    Appearance: She is well-developed.  HENT:     Head: Normocephalic and atraumatic.  Eyes:     Conjunctiva/sclera: Conjunctivae normal.  Neck:     Musculoskeletal: Neck supple.  Cardiovascular:     Rate and Rhythm: Normal rate and regular rhythm.     Heart sounds: No murmur.  Pulmonary:     Effort: Pulmonary effort is normal. No respiratory distress.     Breath sounds: Normal breath sounds.  Abdominal:     Palpations: Abdomen is soft.     Tenderness: There is no abdominal tenderness.  Musculoskeletal: Normal range of motion.  Skin:    General: Skin is warm and dry.  Neurological:     Mental Status: She is alert and oriented to person, place, and time.  Psychiatric:        Mood and Affect: Mood normal.        Behavior: Behavior normal.      ED Treatments / Results  Labs (all labs ordered are listed, but only abnormal results are displayed) Labs Reviewed  BASIC METABOLIC PANEL  CBC  TROPONIN I (HIGH SENSITIVITY)  TROPONIN I (HIGH SENSITIVITY)  I-STAT BETA HCG BLOOD, ED ([facility], [facility], [facility] ONLY)    EKG EKG Interpretation  Date/Time:  Wednesday September 08 2018 00:25:50 EDT Ventricular Rate:  69 PR Interval:  154 QRS Duration: 80 QT Interval:  398 QTC Calculation: 426 R Axis:   50 Text Interpretation:  Normal sinus rhythm Normal ECG Confirmed by Ross MarcusHorton, Courtney ([id number]) on 09/08/2018 4:43:38 AM   Radiology Dg Chest 2 View  Result Date: 09/08/2018 CLINICAL DATA:  Chest pain EXAM: CHEST - 2 VIEW COMPARISON:  None. FINDINGS: The heart size and mediastinal contours are within normal limits. Both lungs are clear. The visualized skeletal  structures are unremarkable. IMPRESSION: No active cardiopulmonary disease. Electronically Signed   By: Katherine Mantlehristopher  Green M.D.   On: 09/08/2018 01:47    Procedures Procedures (including critical care time)  Medications Ordered in ED Medications  sodium chloride flush (NS) 0.9 % injection 3 mL (has no administration in time range)     Initial Impression / Assessment and Plan / ED Course  I have reviewed the triage vital signs and the nursing notes.  Pertinent labs & imaging results that were available during my care of the patient were reviewed by me and considered in my medical decision making (see chart for details).        Patient with chest pain x1 week, which  she reports is been constant.  It is not reproducible.  I doubt ACS, her heart score is 2.  She has had 2 negative high-sensitivity troponins.  Operatory work-up is otherwise reassuring.  She has nonischemic EKG.  Chest x-ray is clear.  At this time, feel that patient can be safely discharged home with primary care follow-up.  Final Clinical Impressions(s) / ED Diagnoses   Final diagnoses:  Nonspecific chest pain    ED Discharge Orders    None       Montine Circle, PA-C 09/08/18 0550    Merryl Hacker, MD 09/09/18 [phone]

## 2018-09-08 NOTE — ED Triage Notes (Signed)
Pt to ED with c/o chest pressure and tingling all over body x's 2 days.  Pt denies any fever.  Pt also c/o shortness of breath and left arm hurting earlier in the week.

## 2018-09-08 NOTE — ED Notes (Signed)
Pt complains of chest pain for the last week. Pt also complains of pain to the head. Pt thought it was her blood pressure or sugars but both were normal.

## 2018-09-12 LAB — NOVEL CORONAVIRUS, NAA: SARS-CoV-2, NAA: NOT DETECTED

## 2018-09-14 ENCOUNTER — Telehealth: Payer: Self-pay

## 2018-09-14 NOTE — Telephone Encounter (Signed)
Advised patients daughter Seth Bake that her COVID 15 test results came back negative.

## 2020-02-07 IMAGING — DX CHEST - 2 VIEW
2 series · 2 of 2 positions shown · non-contrast
Comparison: None.

CLINICAL DATA: Chest pain

EXAM:
CHEST - 2 VIEW

[chest pa]
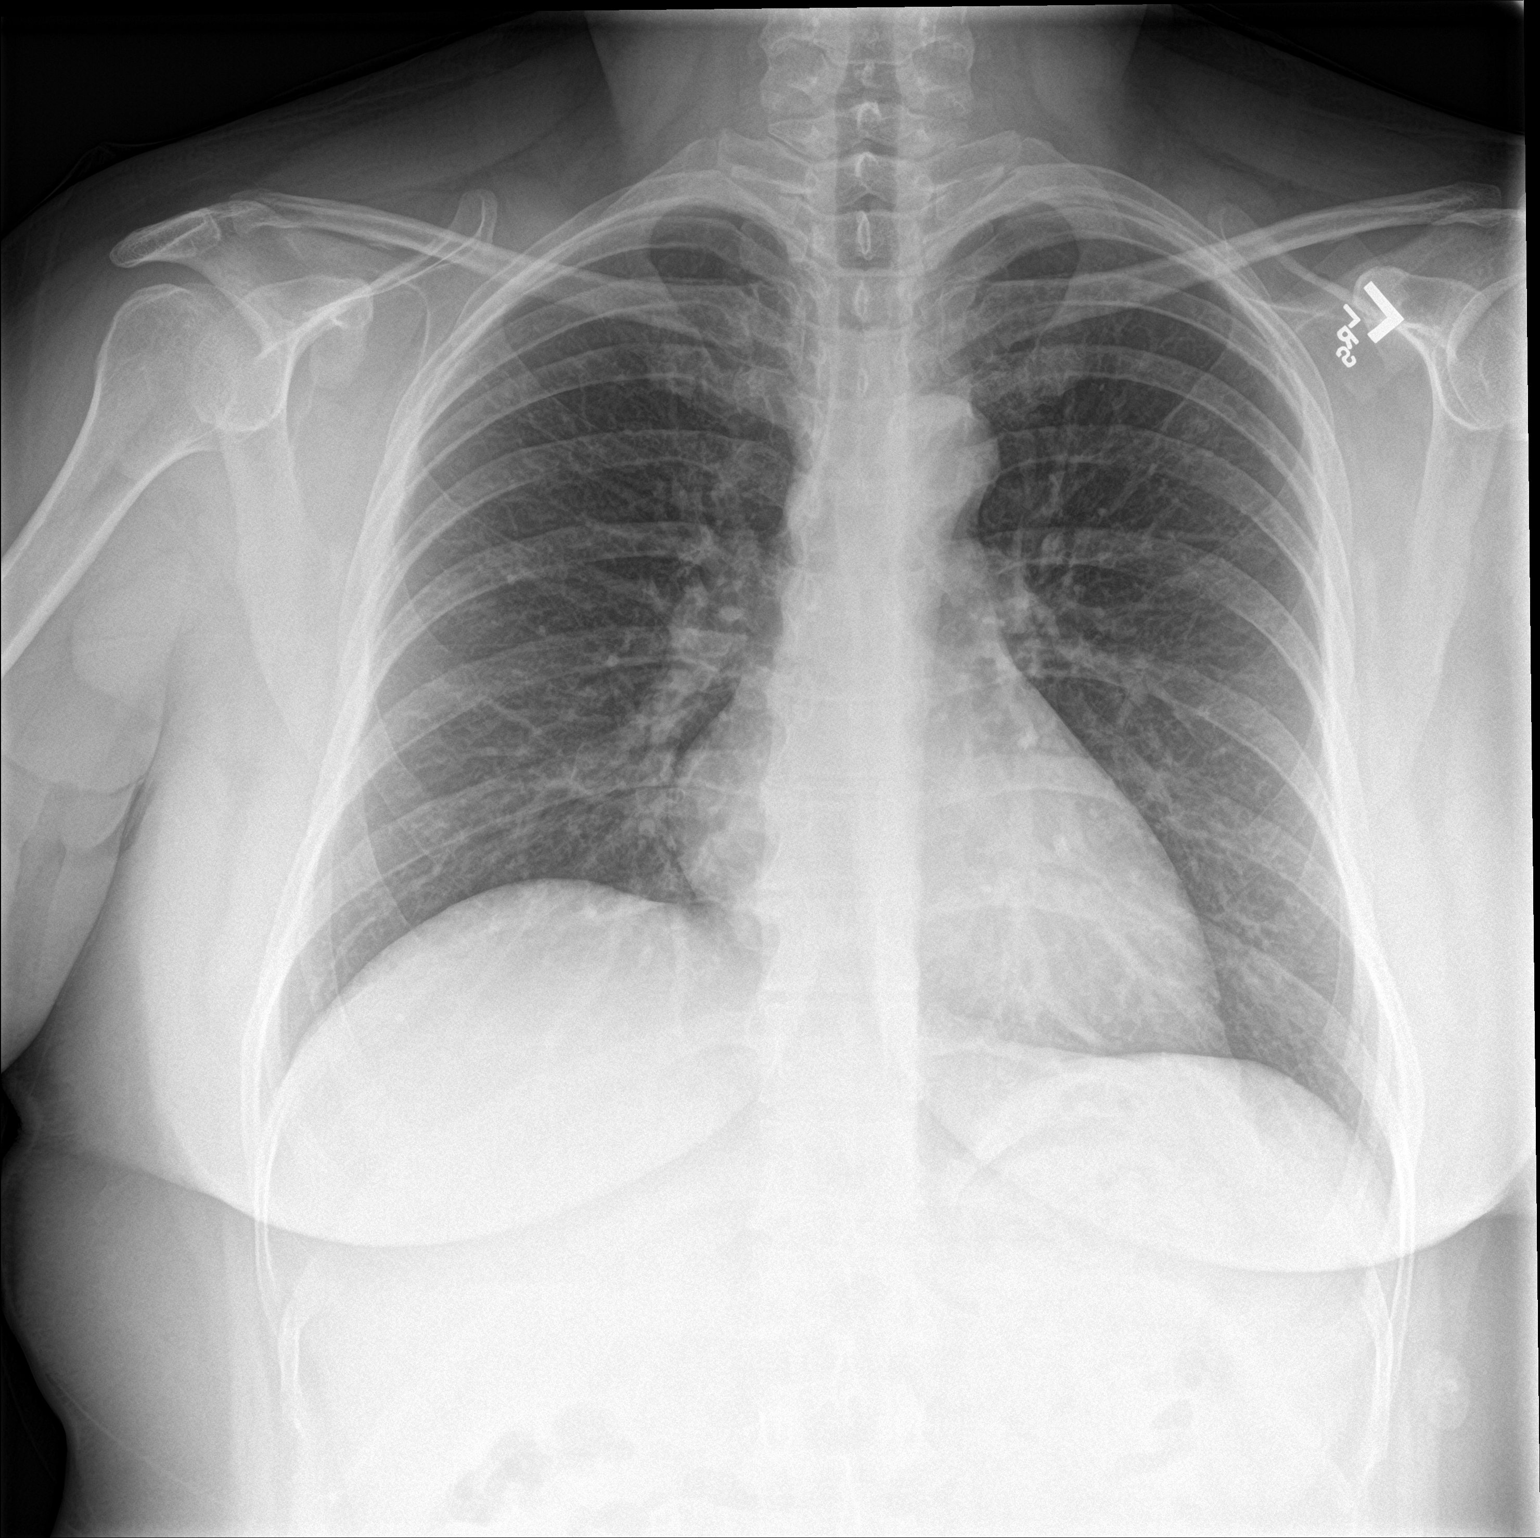

[chest lat]
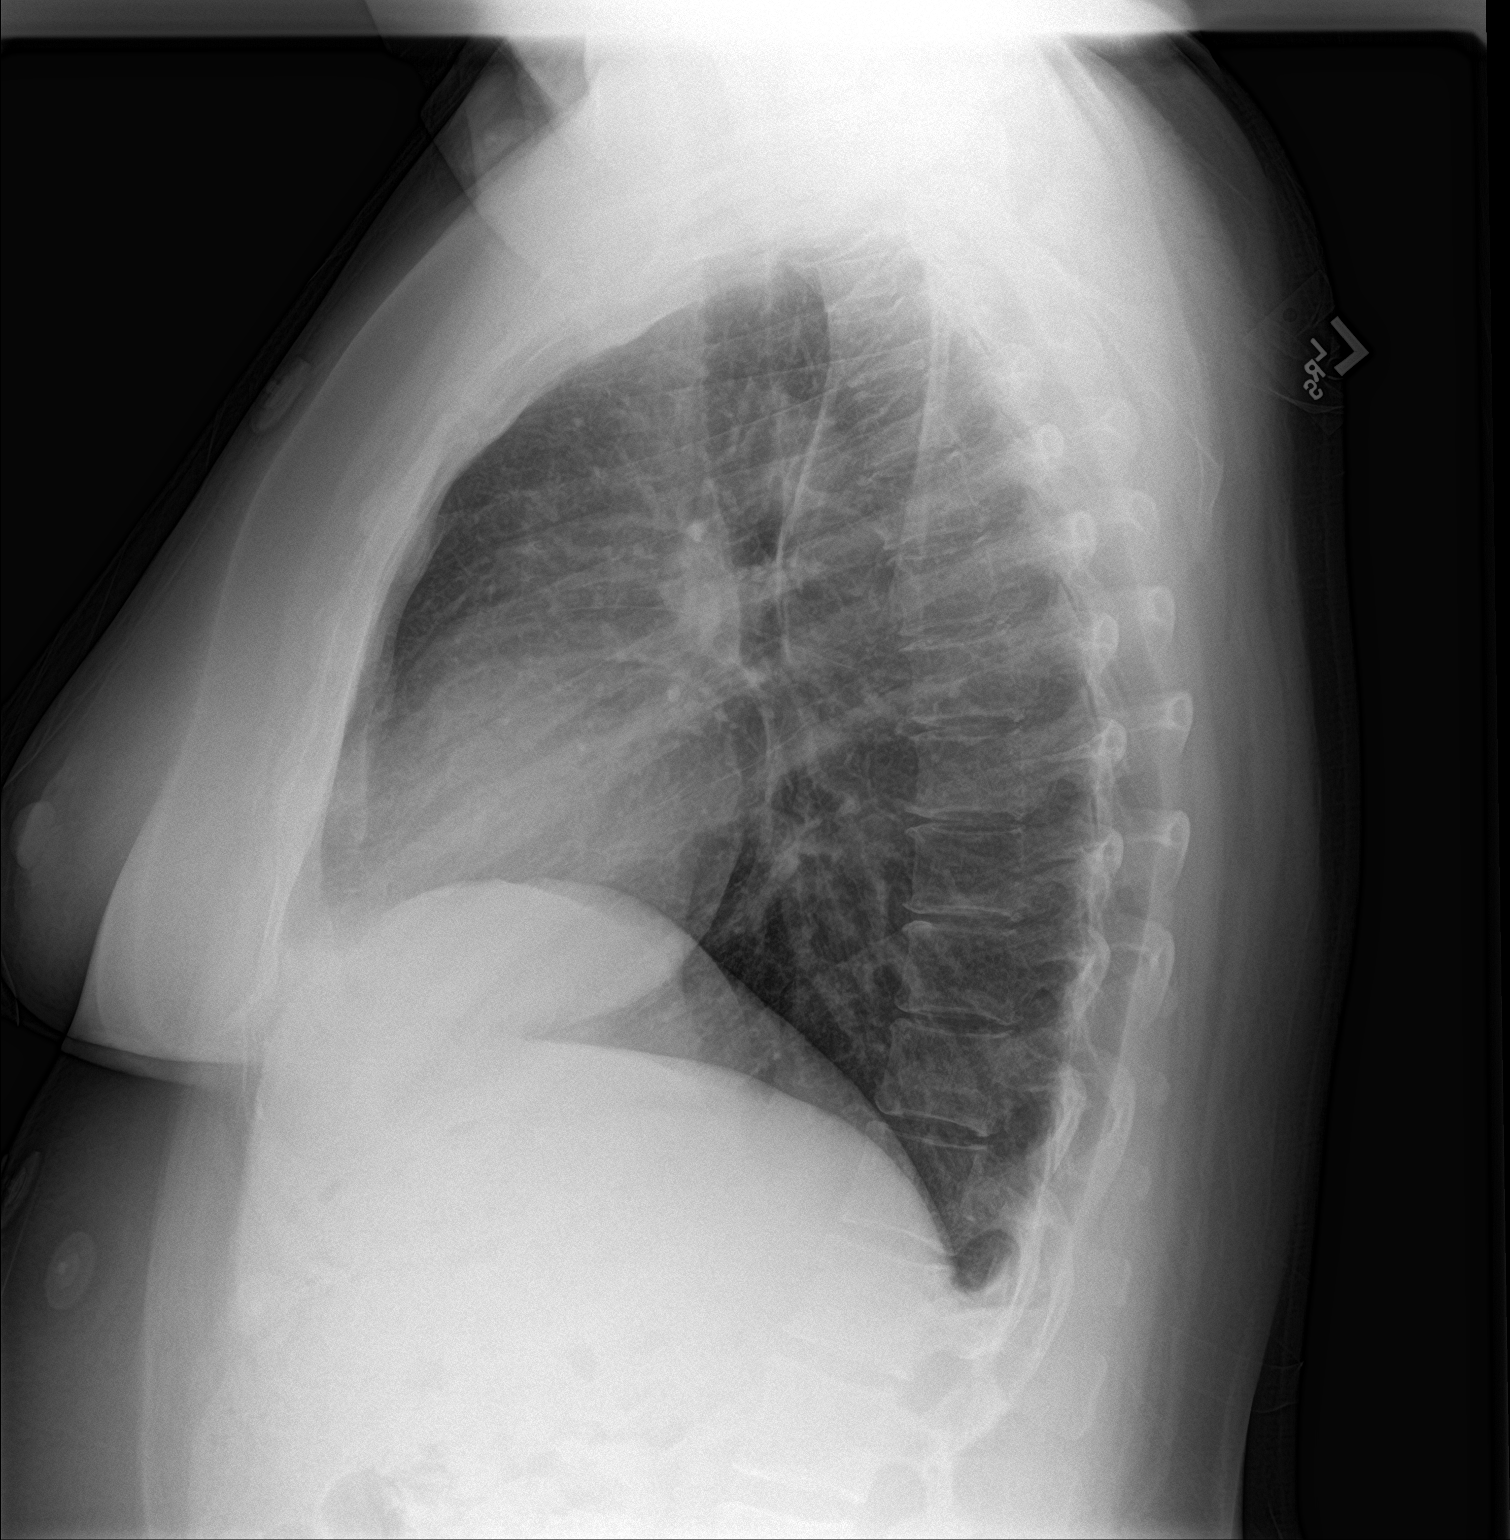

[2 of 2 positions shown; findings below may reference images not displayed]

FINDINGS: The heart size and mediastinal contours are within normal limits.
Both lungs are clear. The visualized skeletal structures are
unremarkable.
IMPRESSION: No active cardiopulmonary disease.
